# Patient Record
Sex: Female | Born: 1945 | Race: Black or African American | Hispanic: No | Smoking: Never smoker
Health system: Southern US, Community
[De-identification: ages and names within clinical notes are randomized; demographics above are authoritative.]

## PROBLEM LIST (undated history)

## (undated) DIAGNOSIS — E119 Type 2 diabetes mellitus without complications: Secondary | ICD-10-CM

## (undated) DIAGNOSIS — I10 Essential (primary) hypertension: Secondary | ICD-10-CM

## (undated) HISTORY — PX: ABDOMINAL HYSTERECTOMY: SHX81

---

## 2006-08-16 ENCOUNTER — Emergency Department: Payer: Self-pay | Admitting: Emergency Medicine

## 2007-03-23 ENCOUNTER — Ambulatory Visit: Payer: Self-pay | Admitting: Emergency Medicine

## 2007-08-01 ENCOUNTER — Ambulatory Visit: Payer: Self-pay | Admitting: Internal Medicine

## 2007-09-25 ENCOUNTER — Ambulatory Visit: Payer: Self-pay | Admitting: Gastroenterology

## 2008-02-19 ENCOUNTER — Ambulatory Visit: Payer: Self-pay | Admitting: Internal Medicine

## 2008-05-06 ENCOUNTER — Ambulatory Visit: Payer: Self-pay | Admitting: Internal Medicine

## 2008-12-25 ENCOUNTER — Ambulatory Visit: Payer: Self-pay | Admitting: Family Medicine

## 2009-03-18 ENCOUNTER — Emergency Department: Payer: Self-pay | Admitting: Emergency Medicine

## 2009-05-11 ENCOUNTER — Ambulatory Visit: Payer: Self-pay | Admitting: Internal Medicine

## 2009-09-07 ENCOUNTER — Ambulatory Visit: Payer: Self-pay | Admitting: Family Medicine

## 2010-05-12 ENCOUNTER — Ambulatory Visit: Payer: Self-pay | Admitting: Family Medicine

## 2010-07-13 ENCOUNTER — Ambulatory Visit: Payer: Self-pay | Admitting: Family Medicine

## 2011-09-05 ENCOUNTER — Ambulatory Visit: Payer: Self-pay | Admitting: Family Medicine

## 2012-04-30 ENCOUNTER — Emergency Department: Payer: Self-pay | Admitting: Emergency Medicine

## 2012-10-16 ENCOUNTER — Ambulatory Visit: Payer: Self-pay | Admitting: Family Medicine

## 2012-10-28 ENCOUNTER — Ambulatory Visit: Payer: Self-pay

## 2013-10-20 ENCOUNTER — Ambulatory Visit: Payer: Self-pay | Admitting: Family Medicine

## 2014-10-27 ENCOUNTER — Ambulatory Visit: Payer: Self-pay | Admitting: Family Medicine

## 2015-07-09 ENCOUNTER — Ambulatory Visit
Admission: EM | Admit: 2015-07-09 | Discharge: 2015-07-09 | Disposition: A | Discharge status: Home / Self Care | Payer: Medicare Other | Attending: Family Medicine | Admitting: Family Medicine

## 2015-07-09 ENCOUNTER — Encounter: Payer: Self-pay | Admitting: Emergency Medicine

## 2015-07-09 DIAGNOSIS — L309 Dermatitis, unspecified: Secondary | ICD-10-CM | POA: Diagnosis not present

## 2015-07-09 DIAGNOSIS — H6593 Unspecified nonsuppurative otitis media, bilateral: Secondary | ICD-10-CM | POA: Diagnosis not present

## 2015-07-09 DIAGNOSIS — J0100 Acute maxillary sinusitis, unspecified: Secondary | ICD-10-CM | POA: Diagnosis not present

## 2015-07-09 DIAGNOSIS — J302 Other seasonal allergic rhinitis: Secondary | ICD-10-CM | POA: Diagnosis not present

## 2015-07-09 HISTORY — DX: Essential (primary) hypertension: I10

## 2015-07-09 HISTORY — DX: Type 2 diabetes mellitus without complications: E11.9

## 2015-07-09 MED ORDER — DESLORATADINE 5 MG PO TABS: 5.0000 mg | ORAL_TABLET | Freq: Every day | ORAL | Status: DC

## 2015-07-09 MED ORDER — AMOXICILLIN-POT CLAVULANATE 875-125 MG PO TABS: 1.0000 | ORAL_TABLET | Freq: Two times a day (BID) | ORAL | Status: DC

## 2015-07-09 MED ORDER — HYDROCORTISONE 2.5 % EX CREA: TOPICAL_CREAM | Freq: Two times a day (BID) | CUTANEOUS | Status: AC

## 2015-07-09 NOTE — Discharge Instructions (Signed)
Allergies °Allergies may happen from anything your body is sensitive to. This may be food, medicines, pollens, chemicals, and nearly anything around you in everyday life that produces allergens. An allergen is anything that causes an allergy producing substance. Heredity is often a factor in causing these problems. This means you may have some of the same allergies as your parents. °Food allergies happen in all age groups. Food allergies are some of the most severe and life threatening. Some common food allergies are cow's milk, seafood, eggs, nuts, wheat, and soybeans. °SYMPTOMS  °· Swelling around the mouth. °· An itchy red rash or hives. °· Vomiting or diarrhea. °· Difficulty breathing. °SEVERE ALLERGIC REACTIONS ARE LIFE-THREATENING. °This reaction is called anaphylaxis. It can cause the mouth and throat to swell and cause difficulty with breathing and swallowing. In severe reactions only a trace amount of food (for example, peanut oil in a salad) may cause death within seconds. °Seasonal allergies occur in all age groups. These are seasonal because they usually occur during the same season every year. They may be a reaction to molds, grass pollens, or tree pollens. Other causes of problems are house dust mite allergens, pet dander, and mold spores. The symptoms often consist of nasal congestion, a runny itchy nose associated with sneezing, and tearing itchy eyes. There is often an associated itching of the mouth and ears. The problems happen when you come in contact with pollens and other allergens. Allergens are the particles in the air that the body reacts to with an allergic reaction. This causes you to release allergic antibodies. Through a chain of events, these eventually cause you to release histamine into the blood stream. Although it is meant to be protective to the body, it is this release that causes your discomfort. This is why you were given anti-histamines to feel better.  If you are unable to  pinpoint the offending allergen, it may be determined by skin or blood testing. Allergies cannot be cured but can be controlled with medicine. °Hay fever is a collection of all or some of the seasonal allergy problems. It may often be treated with simple over-the-counter medicine such as diphenhydramine. Take medicine as directed. Do not drink alcohol or drive while taking this medicine. Check with your caregiver or package insert for child dosages. °If these medicines are not effective, there are many new medicines your caregiver can prescribe. Stronger medicine such as nasal spray, eye drops, and corticosteroids may be used if the first things you try do not work well. Other treatments such as immunotherapy or desensitizing injections can be used if all else fails. Follow up with your caregiver if problems continue. These seasonal allergies are usually not life threatening. They are generally more of a nuisance that can often be handled using medicine. °HOME CARE INSTRUCTIONS  °· If unsure what causes a reaction, keep a diary of foods eaten and symptoms that follow. Avoid foods that cause reactions. °· If hives or rash are present: °· Take medicine as directed. °· You may use an over-the-counter antihistamine (diphenhydramine) for hives and itching as needed. °· Apply cold compresses (cloths) to the skin or take baths in cool water. Avoid hot baths or showers. Heat will make a rash and itching worse. °· If you are severely allergic: °· Following a treatment for a severe reaction, hospitalization is often required for closer follow-up. °· Wear a medic-alert bracelet or necklace stating the allergy. °· You and your family must learn how to give adrenaline or use   an anaphylaxis kit.  If you have had a severe reaction, always carry your anaphylaxis kit or EpiPen with you. Use this medicine as directed by your caregiver if a severe reaction is occurring. Failure to do so could have a fatal outcome. SEEK MEDICAL  CARE IF:  You suspect a food allergy. Symptoms generally happen within 30 minutes of eating a food.  Your symptoms have not gone away within 2 days or are getting worse.  You develop new symptoms.  You want to retest yourself or your child with a food or drink you think causes an allergic reaction. Never do this if an anaphylactic reaction to that food or drink has happened before. Only do this under the care of a caregiver. SEEK IMMEDIATE MEDICAL CARE IF:   You have difficulty breathing, are wheezing, or have a tight feeling in your chest or throat.  You have a swollen mouth, or you have hives, swelling, or itching all over your body.  You have had a severe reaction that has responded to your anaphylaxis kit or an EpiPen. These reactions may return when the medicine has worn off. These reactions should be considered life threatening. MAKE SURE YOU:   Understand these instructions.  Will watch your condition.  Will get help right away if you are not doing well or get worse. Document Released: 01/09/2003 Document Revised: 02/10/2013 Document Reviewed: 06/15/2008 [facility] Patient Information 2015 [place], [place]. This information is not intended to replace advice given to you by your health care provider. Make sure you discuss any questions you have with your health care provider. Contact Dermatitis Contact dermatitis is a reaction to certain substances that touch the skin. Contact dermatitis can be either irritant contact dermatitis or allergic contact dermatitis. Irritant contact dermatitis does not require previous exposure to the substance for a reaction to occur.Allergic contact dermatitis only occurs if you have been exposed to the substance before. Upon a repeat exposure, your body reacts to the substance.  CAUSES  Many substances can cause contact dermatitis. Irritant dermatitis is most commonly caused by repeated exposure to mildly irritating substances, such  as:  Makeup.  Soaps.  Detergents.  Bleaches.  Acids.  Metal salts, such as nickel. Allergic contact dermatitis is most commonly caused by exposure to:  Poisonous plants.  Chemicals (deodorants, shampoos).  Jewelry.  Latex.  Neomycin in triple antibiotic cream.  Preservatives in products, including clothing. SYMPTOMS  The area of skin that is exposed may develop:  Dryness or flaking.  Redness.  Cracks.  Itching.  Pain or a burning sensation.  Blisters. With allergic contact dermatitis, there may also be swelling in areas such as the eyelids, mouth, or genitals.  DIAGNOSIS  Your caregiver can usually tell what the problem is by doing a physical exam. In cases where the cause is uncertain and an allergic contact dermatitis is suspected, a patch skin test may be performed to help determine the cause of your dermatitis. TREATMENT Treatment includes protecting the skin from further contact with the irritating substance by avoiding that substance if possible. Barrier creams, powders, and gloves may be helpful. Your caregiver may also recommend:  Steroid creams or ointments applied 2 times daily. For best results, soak the rash area in cool water for 20 minutes. Then apply the medicine. Cover the area with a plastic wrap. You can store the steroid cream in the refrigerator for a "chilly" effect on your rash. That may decrease itching. Oral steroid medicines may be needed in more severe cases.  Antibiotics or antibacterial ointments if a skin infection is present.  Antihistamine lotion or an antihistamine taken by mouth to ease itching.  Lubricants to keep moisture in your skin.  Burow's solution to reduce redness and soreness or to dry a weeping rash. Mix one packet or tablet of solution in 2 cups cool water. Dip a clean washcloth in the mixture, wring it out a bit, and put it on the affected area. Leave the cloth in place for 30 minutes. Do this as often as possible  throughout the day.  Taking several cornstarch or baking soda baths daily if the area is too large to cover with a washcloth. Harsh chemicals, such as alkalis or acids, can cause skin damage that is like a burn. You should flush your skin for 15 to 20 minutes with cold water after such an exposure. You should also seek immediate medical care after exposure. Bandages (dressings), antibiotics, and pain medicine may be needed for severely irritated skin.  HOME CARE INSTRUCTIONS  Avoid the substance that caused your reaction.  Keep the area of skin that is affected away from hot water, soap, sunlight, chemicals, acidic substances, or anything else that would irritate your skin.  Do not scratch the rash. Scratching may cause the rash to become infected.  You may take cool baths to help stop the itching.  Only take over-the-counter or prescription medicines as directed by your caregiver.  See your caregiver for follow-up care as directed to make sure your skin is healing properly. SEEK MEDICAL CARE IF:   Your condition is not better after 3 days of treatment.  You seem to be getting worse.  You see signs of infection such as swelling, tenderness, redness, soreness, or warmth in the affected area.  You have any problems related to your medicines. Document Released: 10/13/2000 Document Revised: 01/08/2012 Document Reviewed: 03/21/2011 [facility] Patient Information 2015 [place], [place]. This information is not intended to replace advice given to you by your health care provider. Make sure you discuss any questions you have with your health care provider. Sinusitis Sinusitis is redness, soreness, and inflammation of the paranasal sinuses. Paranasal sinuses are air pockets within the bones of your face (beneath the eyes, the middle of the forehead, or above the eyes). In healthy paranasal sinuses, mucus is able to drain out, and air is able to circulate through them by way of your nose. However,  when your paranasal sinuses are inflamed, mucus and air can become trapped. This can allow bacteria and other germs to grow and cause infection. Sinusitis can develop quickly and last only a short time (acute) or continue over a long period (chronic). Sinusitis that lasts for more than 12 weeks is considered chronic.  CAUSES  Causes of sinusitis include:  Allergies.  Structural abnormalities, such as displacement of the cartilage that separates your nostrils (deviated septum), which can decrease the air flow through your nose and sinuses and affect sinus drainage.  Functional abnormalities, such as when the small hairs (cilia) that line your sinuses and help remove mucus do not work properly or are not present. SIGNS AND SYMPTOMS  Symptoms of acute and chronic sinusitis are the same. The primary symptoms are pain and pressure around the affected sinuses. Other symptoms include:  Upper toothache.  Earache.  Headache.  Bad breath.  Decreased sense of smell and taste.  A cough, which worsens when you are lying flat.  Fatigue.  Fever.  Thick drainage from your nose, which often is green and  may contain pus (purulent).  Swelling and warmth over the affected sinuses. DIAGNOSIS  Your health care provider will perform a physical exam. During the exam, your health care provider may:  Look in your nose for signs of abnormal growths in your nostrils (nasal polyps).  Tap over the affected sinus to check for signs of infection.  View the inside of your sinuses (endoscopy) using an imaging device that has a light attached (endoscope). If your health care provider suspects that you have chronic sinusitis, one or more of the following tests may be recommended:  Allergy tests.  Nasal culture. A sample of mucus is taken from your nose, sent to a lab, and screened for bacteria.  Nasal cytology. A sample of mucus is taken from your nose and examined by your health care provider to determine  if your sinusitis is related to an allergy. TREATMENT  Most cases of acute sinusitis are related to a viral infection and will resolve on their own within 10 days. Sometimes medicines are prescribed to help relieve symptoms (pain medicine, decongestants, nasal steroid sprays, or saline sprays).  However, for sinusitis related to a bacterial infection, your health care provider will prescribe antibiotic medicines. These are medicines that will help kill the bacteria causing the infection.  Rarely, sinusitis is caused by a fungal infection. In theses cases, your health care provider will prescribe antifungal medicine. For some cases of chronic sinusitis, surgery is needed. Generally, these are cases in which sinusitis recurs more than 3 times per year, despite other treatments. HOME CARE INSTRUCTIONS   Drink plenty of water. Water helps thin the mucus so your sinuses can drain more easily.  Use a humidifier.  Inhale steam 3 to 4 times a day (for example, sit in the bathroom with the shower running).  Apply a warm, moist washcloth to your face 3 to 4 times a day, or as directed by your health care provider.  Use saline nasal sprays to help moisten and clean your sinuses.  Take medicines only as directed by your health care provider.  If you were prescribed either an antibiotic or antifungal medicine, finish it all even if you start to feel better. SEEK IMMEDIATE MEDICAL CARE IF:  You have increasing pain or severe headaches.  You have nausea, vomiting, or drowsiness.  You have swelling around your face.  You have vision problems.  You have a stiff neck.  You have difficulty breathing. MAKE SURE YOU:   Understand these instructions.  Will watch your condition.  Will get help right away if you are not doing well or get worse. Document Released: 10/16/2005 Document Revised: 03/02/2014 Document Reviewed: 10/31/2011 [facility] Patient Information 2015 [place], [place]. This information  is not intended to replace advice given to you by your health care provider. Make sure you discuss any questions you have with your health care provider.

## 2015-07-09 NOTE — ED Notes (Addendum)
Patient c/o sinus pressure and left ear pain since yesterday.

## 2015-07-11 NOTE — ED Provider Notes (Signed)
CSN: [id number]     Arrival date & time 07/09/15  1436 History   First MD Initiated Contact with Patient 07/09/15 1520     Chief Complaint  Patient presents with  . Facial Pain  . Otalgia   (Consider location/radiation/quality/duration/timing/severity/associated sxs/prior Treatment) HPI Comments: Single caucasian female here for evaluation of rash near her bra strap on torso.  Had similar a couple years ago was given cream by provider and it cleared up.  Patient didn't have any more burn cream at home to apply like last time.  Red, tender without discharge or itchiness.  Patient also complained of cheek pressure has not had a sinus infection this year cannot remember what antibiotics she used in the past.  Using flonase daily and it is not helping right now.  Fingerstick 130 yesterday at home has not yet checked today.  The history is provided by the patient.    Past Medical History  Diagnosis Date  . Hypertension   . Diabetes mellitus without complication    Past Surgical History  Procedure Laterality Date  . Abdominal hysterectomy     History reviewed. No pertinent family history. Social History  Substance Use Topics  . Smoking status: Never Smoker   . Smokeless tobacco: None  . Alcohol Use: No   OB History    No data available     Review of Systems  Constitutional: Negative for fever, chills, diaphoresis, activity change, appetite change and fatigue.  HENT: Positive for congestion, postnasal drip, rhinorrhea and sinus pressure. Negative for dental problem, drooling, ear discharge, ear pain, facial swelling, hearing loss, mouth sores, nosebleeds, sneezing, sore throat, tinnitus, trouble swallowing and voice change.   Eyes: Negative for photophobia, pain, discharge, redness, itching and visual disturbance.  Respiratory: Negative for cough, shortness of breath, wheezing and stridor.   Cardiovascular: Negative for chest pain, palpitations and leg swelling.  Gastrointestinal:  Negative for nausea, vomiting, abdominal pain, diarrhea, constipation, blood in stool and abdominal distention.  Endocrine: Negative for cold intolerance and heat intolerance.  Genitourinary: Negative for dysuria and hematuria.  Musculoskeletal: Negative for myalgias, back pain, joint swelling, arthralgias, gait problem, neck pain and neck stiffness.  Skin: Positive for color change and rash. Negative for pallor and wound.  Allergic/Immunologic: Positive for environmental allergies. Negative for food allergies.  Neurological: Negative for dizziness, tremors, seizures, syncope, facial asymmetry, speech difficulty, weakness, light-headedness, numbness and headaches.  Hematological: Negative for adenopathy. Does not bruise/bleed easily.  Psychiatric/Behavioral: Negative for behavioral problems, confusion, sleep disturbance and agitation.    Allergies  Review of patient's allergies indicates no known allergies.  Home Medications   Prior to Admission medications   Medication Sig Start Date End Date Taking? Authorizing Provider  aspirin 325 MG tablet Take 325 mg by mouth daily.   Yes Historical Provider, MD  fluticasone (FLONASE) 50 MCG/ACT nasal spray Place 1 spray into both nostrils daily.   Yes Historical Provider, MD  losartan (COZAAR) 50 MG tablet Take 100 mg by mouth daily.    Yes Historical Provider, MD  metFORMIN (GLUCOPHAGE) 1000 MG tablet Take 1,000 mg by mouth 2 (two) times daily with a meal.   Yes Historical Provider, MD  ranitidine (ZANTAC) 300 MG capsule Take 300 mg by mouth every evening.   Yes Historical Provider, MD  amoxicillin-clavulanate (AUGMENTIN) 875-125 MG per tablet Take 1 tablet by mouth every 12 (twelve) hours. 07/09/15   Barbaraann Barthel, NP  desloratadine (CLARINEX) 5 MG tablet Take 1 tablet (5 mg total) by  mouth daily. 07/09/15   Barbaraann Barthel, NP  hydrocortisone 2.5 % cream Apply topically 2 (two) times daily. Affected area for 7 to 10 days wash hands after applying  07/09/15   Barbaraann Barthel, NP   Meds Ordered and Administered this Visit  Medications - No data to display  BP 158/86 mmHg  Pulse 60  Temp(Src) 96.9 F (36.1 C) (Tympanic)  Resp 16  Ht 5\' 3"  (1.6 m)  Wt 175 lb (79.379 kg)  BMI 31.01 kg/m2  SpO2 96% No data found.   Physical Exam  Constitutional: She is oriented to person, place, and time. Vital signs are normal. She appears well-developed and well-nourished. No distress.  HENT:  Head: Normocephalic and atraumatic.  Right Ear: Hearing, external ear and ear canal normal. A middle ear effusion is present.  Left Ear: Hearing, external ear and ear canal normal. A middle ear effusion is present.  Nose: Mucosal edema and rhinorrhea present. No nose lacerations, sinus tenderness, nasal deformity, septal deviation or nasal septal hematoma. No epistaxis.  No foreign bodies. Right sinus exhibits maxillary sinus tenderness. Right sinus exhibits no frontal sinus tenderness. Left sinus exhibits maxillary sinus tenderness. Left sinus exhibits no frontal sinus tenderness.  Mouth/Throat: Uvula is midline and mucous membranes are normal. Mucous membranes are not pale, not dry and not cyanotic. She does not have dentures. No oral lesions. No trismus in the jaw. Normal dentition. No dental abscesses, uvula swelling, lacerations or dental caries. Posterior oropharyngeal edema and posterior oropharyngeal erythema present. No oropharyngeal exudate or tonsillar abscesses.  Cobblestoning posterior pharynx; bilateral TMs with air fluid level; maxillary sinsuses TTP bilaterally; nasal turbinates with edema/erythema clear/yellow discharge bilaterally  Eyes: Conjunctivae, EOM and lids are normal. Pupils are equal, round, and reactive to light. Right eye exhibits no discharge. Left eye exhibits no discharge. No scleral icterus.  Neck: Trachea normal and normal range of motion. Neck supple. No tracheal deviation present. No thyromegaly present.  Cardiovascular: Normal  rate, regular rhythm, S1 normal, S2 normal, normal heart sounds and intact distal pulses.  PMI is not displaced.  Exam reveals no gallop and no friction rub.   No murmur heard. Pulmonary/Chest: Effort normal. No accessory muscle usage or stridor. No respiratory distress. She has no decreased breath sounds. She has no wheezes. She has no rhonchi. She has no rales.  Abdominal: Soft. She exhibits no distension.  Musculoskeletal: Normal range of motion. She exhibits no edema or tenderness.  Lymphadenopathy:    She has no cervical adenopathy.  Neurological: She is alert and oriented to person, place, and time. She exhibits normal muscle tone. Coordination normal.  Skin: Skin is warm, dry and intact. Rash noted. No abrasion, no bruising, no burn, no ecchymosis, no laceration, no lesion, no petechiae and no purpura noted. Rash is maculopapular. Rash is not macular, not papular, not nodular, not pustular, not vesicular and not urticarial. She is not diaphoretic. There is erythema. No cyanosis. No pallor. Nails show no clubbing.     2-macular papular lesions dry erythematous slightly ttp lateral thorax superior to chest band of bra  Psychiatric: She has a normal mood and affect. Her speech is normal and behavior is normal. Judgment and thought content normal. Cognition and memory are normal.  Nursing note and vitals reviewed.   ED Course  Procedures (including critical care time)  Labs Review Labs Reviewed - No data to display  Imaging Review No results found.    MDM   1. Acute maxillary sinusitis, recurrence  not specified   2. Otitis media with effusion, bilateral   3. Dermatitis   4. Other seasonal allergic rhinitis   Patient already on flonase without relief of symptoms.  Start augmentin 875mg  po BID x 10 days.  Nasal saline 2 sprays each nostril q2h while awake.  Start clarinex 5mg  po daily as history allergic rhinitis not controlled with flonase.  No evidence of systemic bacterial  infection, non toxic and well hydrated.  I do not see where any further testing or imaging is necessary at this time.   I will suggest supportive care, rest, good hygiene and encourage the patient to take adequate fluids.  Avoid triggers if possible.  Shower prior to bedtime if exposed to triggers.  If allergic dust/dust mites recommend mattress/pillow covers/encasements; washing linens, vacuuming, sweeping, dusting weekly.  The patient is to return to clinic or EMERGENCY ROOM if symptoms worsen or change significantly.  Exitcare handout on sinusitis given to patient.  Patient verbalized agreement and understanding of treatment plan and had no further questions at this time.   P2:  Avoidance, Hand washing and cover cough  Supportive treatment.   No evidence of invasive bacterial infection, non toxic and well hydrated.  This is most likely self limiting viral infection.  I do not see where any further testing or imaging is necessary at this time.   I will suggest supportive care, rest, good hygiene and encourage the patient to take adequate fluids.  The patient is to return to clinic or EMERGENCY ROOM if symptoms worsen or change significantly e.g. ear pain, fever, purulent discharge from ears or bleeding.  Exitcare handout on otitis media with effusion given to patient.  Patient verbalized agreement and understanding of treatment plan.    Medication as directed. Hydrocortisone 2.5% cream BID topical to affected area.  Wash hands after application.  Avoid scratching.  Follow up if worsening erythema, rash spreading, purulent discharge, pain, fever.   Symptomatic therapy suggested.  Warm to cool water soaks and/or oatmeal baths.  Call or return to clinic as needed if these symptoms worsen or fail to improve as anticipated.  Exitcare handout on dermatitis given to patient.  Patient verbalized agreement and understanding of treatment plan and had no further questions at this time.   P2:  Avoidance and hand  washing.    Barbaraann Barthel, NP 07/11/15 [phone]

## 2016-04-07 ENCOUNTER — Ambulatory Visit
Admission: EM | Admit: 2016-04-07 | Discharge: 2016-04-07 | Disposition: A | Discharge status: Home / Self Care | Payer: BC Managed Care – PPO | Attending: Family Medicine | Admitting: Family Medicine

## 2016-04-07 DIAGNOSIS — M5432 Sciatica, left side: Secondary | ICD-10-CM | POA: Diagnosis not present

## 2016-04-07 DIAGNOSIS — M5442 Lumbago with sciatica, left side: Secondary | ICD-10-CM | POA: Diagnosis not present

## 2016-04-07 MED ORDER — PREDNISONE 20 MG PO TABS: ORAL_TABLET | ORAL | Status: DC

## 2016-04-07 MED ORDER — HYDROCODONE-ACETAMINOPHEN 5-325 MG PO TABS: ORAL_TABLET | ORAL | Status: DC

## 2016-04-07 MED ORDER — CYCLOBENZAPRINE HCL 10 MG PO TABS: 10.0000 mg | ORAL_TABLET | Freq: Every day | ORAL | Status: DC

## 2016-04-07 NOTE — ED Provider Notes (Signed)
CSN: [id number]     Arrival date & time 04/07/16  1538 History   First MD Initiated Contact with Patient 04/07/16 1626     Chief Complaint  Patient presents with  . Back Pain   (Consider location/radiation/quality/duration/timing/severity/associated sxs/prior Treatment) HPI Comments: 70 yo female with a 4 days h/o left lower back and left buttock pain radiating down the left leg. Symptoms started after she had been sweeping the porch. Denies any fall, injury, trauma. Denies any fevers, chills, numbness/tingling, weakness, saddle anesthesia, bowel or bladder problems.   The history is provided by the patient.    Past Medical History  Diagnosis Date  . Hypertension   . Diabetes mellitus without complication [facility])    Past Surgical History  Procedure Laterality Date  . Abdominal hysterectomy     No family history on file. Social History  Substance Use Topics  . Smoking status: Never Smoker   . Smokeless tobacco: None  . Alcohol Use: No   OB History    No data available     Review of Systems  Allergies  Review of patient's allergies indicates no known allergies.  Home Medications   Prior to Admission medications   Medication Sig Start Date End Date Taking? Authorizing Provider  aspirin 325 MG tablet Take 325 mg by mouth daily.   Yes Historical Provider, MD  fluticasone (FLONASE) 50 MCG/ACT nasal spray Place 1 spray into both nostrils daily.   Yes Historical Provider, MD  losartan (COZAAR) 50 MG tablet Take 100 mg by mouth daily.    Yes Historical Provider, MD  metFORMIN (GLUCOPHAGE) 1000 MG tablet Take 1,000 mg by mouth 2 (two) times daily with a meal.   Yes Historical Provider, MD  ranitidine (ZANTAC) 300 MG capsule Take 300 mg by mouth every evening.   Yes Historical Provider, MD  amoxicillin-clavulanate (AUGMENTIN) 875-125 MG per tablet Take 1 tablet by mouth every 12 (twelve) hours. 07/09/15   Barbaraann Barthel, NP  cyclobenzaprine (FLEXERIL) 10 MG tablet Take 1 tablet (10  mg total) by mouth at bedtime. 04/07/16   Payton Mccallum, MD  desloratadine (CLARINEX) 5 MG tablet Take 1 tablet (5 mg total) by mouth daily. 07/09/15   Barbaraann Barthel, NP  HYDROcodone-acetaminophen (NORCO/VICODIN) 5-325 MG tablet Half to one tab po qhs prn pain 04/07/16   Payton Mccallum, MD  hydrocortisone 2.5 % cream Apply topically 2 (two) times daily. Affected area for 7 to 10 days wash hands after applying 07/09/15   Barbaraann Barthel, NP  predniSONE (DELTASONE) 20 MG tablet 2 tabs po qd for 5 days 04/07/16   Payton Mccallum, MD   Meds Ordered and Administered this Visit  Medications - No data to display  BP 142/72 mmHg  Pulse 78  Temp(Src) 98.2 F (36.8 C) (Oral)  Resp 18  Ht 5\' 2"  (1.575 m)  Wt 172 lb (78.019 kg)  BMI 31.45 kg/m2  SpO2 98% No data found.   Physical Exam  Constitutional: She appears well-developed and well-nourished. No distress.  Musculoskeletal: She exhibits tenderness. She exhibits no edema.       Lumbar back: She exhibits tenderness (over the left lumbar sacral paraspinous muscles and left buttock) and spasm. She exhibits normal range of motion, no bony tenderness, no swelling, no edema, no deformity, no laceration, no pain and normal pulse.       Back:  Neurological: She is alert. She has normal reflexes. She exhibits normal muscle tone.  Skin: Skin is warm and dry. No  rash noted. She is not diaphoretic. No erythema.  Nursing note and vitals reviewed.   ED Course  Procedures (including critical care time)  Labs Review Labs Reviewed - No data to display  Imaging Review No results found.   Visual Acuity Review  Right Eye Distance:   Left Eye Distance:   Bilateral Distance:    Right Eye Near:   Left Eye Near:    Bilateral Near:         MDM   1. Sciatica of left side   2. Left-sided low back pain with left-sided sciatica    Discharge Medication List as of 04/07/2016  4:52 PM    START taking these medications   Details  cyclobenzaprine  (FLEXERIL) 10 MG tablet Take 1 tablet (10 mg total) by mouth at bedtime., Starting 04/07/2016, Until Discontinued, Normal    HYDROcodone-acetaminophen (NORCO/VICODIN) 5-325 MG tablet Half to one tab po qhs prn pain, Print    predniSONE (DELTASONE) 20 MG tablet 2 tabs po qd for 5 days, Normal        1. diagnosis reviewed with patient 2. rx as per orders above; reviewed possible side effects, interactions, risks and benefits  3. Recommend supportive treatment with rest, heat, gentle stretching 4. Follow-up prn if symptoms worsen or don't  improve  Payton Mccallumrlando Jep Dyas, MD 04/07/16 1950

## 2016-04-07 NOTE — Discharge Instructions (Signed)

## 2016-04-07 NOTE — ED Notes (Signed)
Pt c/o left lower back pain that radiates into the left leg since Tuesday, states it started while she was sweeping the porch.

## 2016-09-06 ENCOUNTER — Ambulatory Visit (INDEPENDENT_AMBULATORY_CARE_PROVIDER_SITE_OTHER): Payer: BC Managed Care – PPO

## 2016-09-06 ENCOUNTER — Encounter: Payer: Self-pay | Admitting: Emergency Medicine

## 2016-09-06 ENCOUNTER — Ambulatory Visit
Admission: EM | Admit: 2016-09-06 | Discharge: 2016-09-06 | Disposition: A | Discharge status: Home / Self Care | Payer: BC Managed Care – PPO | Attending: Family Medicine | Admitting: Family Medicine

## 2016-09-06 DIAGNOSIS — M25512 Pain in left shoulder: Secondary | ICD-10-CM | POA: Diagnosis not present

## 2016-09-06 DIAGNOSIS — M7522 Bicipital tendinitis, left shoulder: Secondary | ICD-10-CM | POA: Diagnosis not present

## 2016-09-06 DIAGNOSIS — I10 Essential (primary) hypertension: Secondary | ICD-10-CM | POA: Diagnosis not present

## 2016-09-06 MED ORDER — MELOXICAM 7.5 MG PO TABS: 7.5000 mg | ORAL_TABLET | Freq: Two times a day (BID) | ORAL | 0 refills | Status: AC | PRN

## 2016-09-06 NOTE — ED Triage Notes (Signed)
Patient c/o pain in her left shoulder that radiates down her left arm since October.  Patient denies chest pain or SOB.

## 2016-09-06 NOTE — ED Provider Notes (Signed)
[facility]    CSN: [id number] Arrival date & time: 09/06/16  1324     History   Chief Complaint Chief Complaint  Patient presents with  . Shoulder Pain  . Arm Pain    HPI Jade Blevins is a 70 y.o. female.   Patient is a 70 year old black female with left shoulder pain. She's had shoulder pain now for several weeks he states that the pain started after she got her flu shot in mid to early October. She didn't dealing with the left shoulder pain until today when the pain became excruciating. She reports trying to lift a heavy iron skillet and had sharp pain going in her left shoulder. She states that she was unable to function at work so she came here. Past medical history no history of hypertension or diabetes that she's being treated for but she states she's borderline in both those conditions. She's had abdominal hysterectomy area no known drug allergies. No pertinent family history relevant to today's visit.   The history is provided by the patient.  Shoulder Pain  Location:  Shoulder Shoulder location:  L shoulder Pain details:    Quality:  Aching and sharp   Radiates to:  Does not radiate   Severity:  Severe   Onset quality:  Unable to specify   Timing:  Constant   Progression:  Worsening Handedness:  Right-handed Dislocation: no   Foreign body present:  No foreign bodies Relieved by:  Nothing Worsened by:  Nothing Ineffective treatments:  None tried Associated symptoms: muscle weakness   Associated symptoms: no back pain, no fatigue, no fever, no neck pain and no stiffness   Risk factors: no concern for non-accidental trauma and no frequent fractures   Arm Pain     Past Medical History:  Diagnosis Date  . Diabetes mellitus without complication (HCC)   . Hypertension     There are no active problems to display for this patient.   Past Surgical History:  Procedure Laterality Date  . ABDOMINAL HYSTERECTOMY      OB History    No data  available       Home Medications    Prior to Admission medications   Medication Sig Start Date End Date Taking? Authorizing Provider  aspirin 325 MG tablet Take 325 mg by mouth daily.    Historical Provider, MD  fluticasone (FLONASE) 50 MCG/ACT nasal spray Place 1 spray into both nostrils daily.    Historical Provider, MD  hydrocortisone 2.5 % cream Apply topically 2 (two) times daily. Affected area for 7 to 10 days wash hands after applying 07/09/15   Barbaraann Barthelina A Betancourt, NP  losartan (COZAAR) 50 MG tablet Take 100 mg by mouth daily.     Historical Provider, MD  meloxicam (MOBIC) 7.5 MG tablet Take 1 tablet (7.5 mg total) by mouth 2 (two) times daily as needed for pain. 09/06/16   Hassan RowanEugene Kenyanna Grzesiak, MD  metFORMIN (GLUCOPHAGE) 1000 MG tablet Take 1,000 mg by mouth 2 (two) times daily with a meal.    Historical Provider, MD  ranitidine (ZANTAC) 300 MG capsule Take 300 mg by mouth every evening.    Historical Provider, MD    Family History History reviewed. No pertinent family history.  Social History Social History  Substance Use Topics  . Smoking status: Never Smoker  . Smokeless tobacco: Never Used  . Alcohol use No     Allergies   Patient has no known allergies.   Review of Systems Review  of Systems  Constitutional: Negative for fatigue and fever.  Musculoskeletal: Positive for arthralgias and myalgias. Negative for back pain, neck pain and stiffness.     Physical Exam Triage Vital Signs ED Triage Vitals  Enc Vitals Group     BP 09/06/16 1336 139/64     Pulse Rate 09/06/16 1336 76     Resp 09/06/16 1336 16     Temp 09/06/16 1336 97.3 F (36.3 C)     Temp Source 09/06/16 1336 Tympanic     SpO2 09/06/16 1336 98 %     Weight 09/06/16 1335 172 lb (78 kg)     Height 09/06/16 1335 5\' 2"  (1.575 m)     Head Circumference --      Peak Flow --      Pain Score 09/06/16 1337 9     Pain Loc --      Pain Edu? --      Excl. in GC? --    No data found.   Updated Vital Signs BP  139/64 (BP Location: Right Arm)   Pulse 76   Temp 97.3 F (36.3 C) (Tympanic)   Resp 16   Ht 5\' 2"  (1.575 m)   Wt 172 lb (78 kg)   SpO2 98%   BMI 31.46 kg/m   Visual Acuity Right Eye Distance:   Left Eye Distance:   Bilateral Distance:    Right Eye Near:   Left Eye Near:    Bilateral Near:     Physical Exam  Constitutional: She is oriented to person, place, and time. She appears well-developed.  HENT:  Head: Normocephalic and atraumatic.  Right Ear: External ear normal.  Eyes: Conjunctivae are normal. Pupils are equal, round, and reactive to light.  Neck: Normal range of motion. Neck supple.  Cardiovascular: Normal rate, regular rhythm and normal heart sounds.   Pulmonary/Chest: Effort normal and breath sounds normal.  Musculoskeletal: She exhibits tenderness.       Left shoulder: She exhibits decreased range of motion, tenderness, bony tenderness and pain.       Arms: Patient is able to resist pressure on the "empty beer can maneuver, and with effort she is able to raise left arm above her head which indicates no stiff to rotator cuff injury but she has pinpoint tenderness over the left biceps tendon consistent with biceps tendinitis  Neurological: She is alert and oriented to person, place, and time. No cranial nerve deficit.  Skin: Skin is warm.  Psychiatric: She has a normal mood and affect.  Vitals reviewed.    UC Treatments / Results  Labs (all labs ordered are listed, but only abnormal results are displayed) Labs Reviewed - No data to display  EKG  EKG Interpretation None     ED ECG REPORT I, Esme Durkin H, the attending physician, personally viewed and interpreted this ECG.   Date: 09/06/2016  EKG Time: 13:45:40  Rate: 74  Rhythm: normal EKG, normal sinus rhythm, there are no previous tracings available for comparison  Axis: 33  Intervals:none  ST&T Change: none  Radiology No results found.  Procedures Procedures (including critical care  time)  Medications Ordered in UC Medications - No data to display   Initial Impression / Assessment and Plan / UC Course  I have reviewed the triage vital signs and the nursing notes.  Pertinent labs & imaging results that were available during my care of the patient were reviewed by me and considered in my medical decision making (see chart  for details).  Clinical Course    Patient will have x-ray of her left shoulder but informed her that she probably wouldn't need to have a injection in the bicipital tendon area by an orthopedic or by her PCP. But in the meantime we will try her on some Mobic 7.5 one tablet once twice a day Cipro helps. Will give a work note for today and tomorrow.   Final Clinical Impressions(s) / UC Diagnoses   Final diagnoses:  Acute pain of left shoulder  Biceps tendonitis on left    New Prescriptions New Prescriptions   MELOXICAM (MOBIC) 7.5 MG TABLET    Take 1 tablet (7.5 mg total) by mouth 2 (two) times daily as needed for pain.     Hassan Rowan, MD 09/06/16 1500

## 2021-07-12 ENCOUNTER — Encounter: Payer: Self-pay | Admitting: Emergency Medicine

## 2021-07-12 ENCOUNTER — Ambulatory Visit
Admission: EM | Admit: 2021-07-12 | Discharge: 2021-07-12 | Disposition: A | Discharge status: Home / Self Care | Payer: BC Managed Care – PPO

## 2021-07-12 ENCOUNTER — Ambulatory Visit (INDEPENDENT_AMBULATORY_CARE_PROVIDER_SITE_OTHER): Payer: BC Managed Care – PPO

## 2021-07-12 ENCOUNTER — Other Ambulatory Visit: Payer: Self-pay

## 2021-07-12 DIAGNOSIS — M25531 Pain in right wrist: Secondary | ICD-10-CM

## 2021-07-12 DIAGNOSIS — M25431 Effusion, right wrist: Secondary | ICD-10-CM | POA: Diagnosis not present

## 2021-07-12 DIAGNOSIS — S62001A Unspecified fracture of navicular [scaphoid] bone of right wrist, initial encounter for closed fracture: Secondary | ICD-10-CM | POA: Diagnosis not present

## 2021-07-12 MED ORDER — TRAMADOL HCL 50 MG PO TABS: 50.0000 mg | ORAL_TABLET | Freq: Three times a day (TID) | ORAL | 0 refills | Status: AC | PRN

## 2021-07-12 NOTE — Discharge Instructions (Addendum)
HAND FRACTURE: You have a small displaced fracture in your hand.  This is something that will require you to follow-up with orthopedics in the next couple of days.  You have been placed in a splint that you cannot take off and given a sling.  Wear the sling as needed for comfort.  Keep the hand/wrist elevated to help with swelling.  I have sent in tramadol for pain.  This may make you little drowsy so take it at home and do not try to drive while taking.  Do not use affected extremity until cleared by Ortho.  You have a condition requiring you to follow up with Orthopedics so please call one of the following office for appointment:   [facility] [address] [place], [place] [address] Phone: [phone]  [facility] [address], [place], [place] [address] Phone: [phone]

## 2021-07-12 NOTE — ED Triage Notes (Signed)
Pt c/o right wrist pain. Started about 3 weeks. She was seen at [facility] and given prednisone. She finished the prednisone and does not feel any better. She states she did not get an xray. She states the pain is keeping her up at night. She has swelling in the area also. She is wearing a wrist brace and states it helps.

## 2021-07-12 NOTE — ED Provider Notes (Signed)
[facility]    CSN: [id number] Arrival date & time: 07/12/21  1552      History   Chief Complaint Chief Complaint  Patient presents with   Wrist Pain    right    HPI Jade Blevins is a 75 y.o. female presenting for 3-week history of pain and swelling of the right hand and wrist.  Patient denies any known injury.  She says she fell 4 months ago, but no other injuries more recent. She did go to [facility] urgent care at the onset of her pain and was unable to have an x-ray due to machines being down.  Patient was given 5-day course of prednisone but says it did not help.  She has been wearing a wrist brace and taking Tylenol which seemed to help a little but admits to continued pain and concerns regarding this.  No known history of arthritis of her hand or wrist.  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  No recent worsening of symptoms.  No numbness, tingling or weakness.  No other concerns.  HPI  Past Medical History:  Diagnosis Date   Diabetes mellitus without complication (HCC)    Hypertension     There are no problems to display for this patient.   Past Surgical History:  Procedure Laterality Date   ABDOMINAL HYSTERECTOMY      OB History   No obstetric history on file.      Home Medications    Prior to Admission medications   Medication Sig Start Date End Date Taking? Authorizing Provider  aspirin 81 MG EC tablet Take 1 tablet by mouth daily. 05/02/20  Yes [provider]  RYBELSUS 3 MG TABS Take 1 tablet by mouth every morning. 07/11/21  Yes [provider]  traMADol (ULTRAM) 50 MG tablet Take 1 tablet (50 mg total) by mouth every 8 (eight) hours as needed for up to 5 days. 07/12/21 07/17/21 Yes Eusebio Friendly B, PA-C  amLODipine (NORVASC) 10 MG tablet Take 10 mg by mouth daily. 02/15/21   [provider]  aspirin 325 MG tablet Take 325 mg by mouth daily.    [provider]  fluticasone (FLONASE) 50 MCG/ACT nasal spray Place 1  spray into both nostrils daily.    [provider]  hydrocortisone 2.5 % cream Apply topically 2 (two) times daily. Affected area for 7 to 10 days wash hands after applying 07/09/15   Betancourt, Jarold Song, NP  LEVEMIR FLEXTOUCH 100 UNIT/ML FlexTouch Pen Inject into the skin. 06/17/21   [provider]  losartan (COZAAR) 50 MG tablet Take 100 mg by mouth daily.     [provider]  meloxicam (MOBIC) 7.5 MG tablet Take 1 tablet (7.5 mg total) by mouth 2 (two) times daily as needed for pain. 09/06/16   Hassan Rowan, MD  metFORMIN (GLUCOPHAGE) 1000 MG tablet Take 1,000 mg by mouth 2 (two) times daily with a meal.    [provider]  ranitidine (ZANTAC) 300 MG capsule Take 300 mg by mouth every evening.    [provider]    Family History No family history on file.  Social History Social History   Tobacco Use   Smoking status: Never   Smokeless tobacco: Never  Vaping Use   Vaping Use: Never used  Substance Use Topics   Alcohol use: No   Drug use: Not Currently     Allergies   Patient has no known allergies.   Review of Systems  Review of Systems  Musculoskeletal:  Positive for arthralgias and joint swelling.  Skin:  Negative for color change and rash.  Neurological:  Negative for weakness and numbness.    Physical Exam Triage Vital Signs ED Triage Vitals  Enc Vitals Group     BP 07/12/21 1610 (!) 175/81     Pulse Rate 07/12/21 1610 (!) 58     Resp 07/12/21 1610 18     Temp 07/12/21 1610 98.8 F (37.1 C)     Temp Source 07/12/21 1610 Oral     SpO2 07/12/21 1610 95 %     Weight 07/12/21 1604 171 lb 15.3 oz (78 kg)     Height 07/12/21 1604 5\' 2"  (1.575 m)     Head Circumference --      Peak Flow --      Pain Score 07/12/21 1604 7     Pain Loc --      Pain Edu? --      Excl. in GC? --    No data found.  Updated Vital Signs BP (!) 175/81 (BP Location: Left Arm)   Pulse (!) 58   Temp 98.8 F (37.1 C) (Oral)   Resp 18   Ht 5'  2" (1.575 m)   Wt 171 lb 15.3 oz (78 kg)   SpO2 95%   BMI 31.45 kg/m      Physical Exam Vitals and nursing note reviewed.  Constitutional:      General: She is not in acute distress.    Appearance: Normal appearance. She is not ill-appearing or toxic-appearing.  HENT:     Head: Normocephalic and atraumatic.  Eyes:     General: No scleral icterus.       Right eye: No discharge.        Left eye: No discharge.     Conjunctiva/sclera: Conjunctivae normal.  Cardiovascular:     Rate and Rhythm: Regular rhythm. Bradycardia present.     Pulses: Normal pulses.  Pulmonary:     Effort: Pulmonary effort is normal. No respiratory distress.  Musculoskeletal:     Cervical back: Neck supple.     Comments: RIGHT HAND/ WRIST: Mild/moderate swelling distal radial wrist and base of thumb. Swelling also noted over second and third MCP joints. Decreased ROM of hand and wrist.  TTP over scaphoid, distal radius and second and third MCP joints.  2+ radial pulses.  Skin:    General: Skin is dry.  Neurological:     General: No focal deficit present.     Mental Status: She is alert. Mental status is at baseline.     Motor: No weakness.     Coordination: Coordination normal.     Gait: Gait normal.  Psychiatric:        Mood and Affect: Mood normal.        Behavior: Behavior normal.        Thought Content: Thought content normal.     UC Treatments / Results  Labs (all labs ordered are listed, but only abnormal results are displayed) Labs Reviewed - No data to display  EKG   Radiology DG Wrist Complete Right  Result Date: 07/12/2021 CLINICAL DATA:  Right wrist swelling and pain for 3 weeks, no known injury, initial encounter EXAM: RIGHT WRIST - COMPLETE 3+ VIEW COMPARISON:  None. FINDINGS: Mild soft tissue swelling is noted. There is a minimally displaced fracture through the midshaft of the scaphoid bone identified. No other bony abnormality is seen. IMPRESSION: Minimally displaced midshaft  scaphoid fracture. These results will be called to the ordering clinician or representative by the Radiologist Assistant, and communication documented in the PACS or [employer]. Electronically Signed   By: Alcide Clever M.D.   On: 07/12/2021 16:29    Procedures Procedures (including critical care time)  Medications Ordered in UC Medications - No data to display  Initial Impression / Assessment and Plan / UC Course  I have reviewed the triage vital signs and the nursing notes.  Pertinent labs & imaging results that were available during my care of the patient were reviewed by me and considered in my medical decision making (see chart for details).  75 year old female presenting for apparent atraumatic right hand and wrist pain for the past 3 weeks.  Exam does reveal swelling over the second and third MCP joints and distal radial wrist as well as base of thumb.  Tenderness palpation of the scaphoid, distal radius and second and third MCP joints.  Decreased range of motion, good pulses.  X-ray ordered and independently reviewed by me.   X-ray today shows minimally displaced midshaft scaphoid fracture.  Reviewed results with patient.  She cannot recall any recent injuries.  Patient placed in thumb spica splint and sling.  Advised that she needs to follow-up with orthopedics since this can be a complicated injury since it is involving the scaphoid bone.  May require surgery. Patient advised to follow-up with [facility] or [facility] in the next couple of days.  I did send a short supply of Ultram for pain relief.  Reviewed supportive care guidelines.  Patient plans to follow-up with [facility] in the next 1 to 2 days.  Final Clinical Impressions(s) / UC Diagnoses   Final diagnoses:  Closed displaced fracture of scaphoid of right wrist, unspecified portion of scaphoid, initial encounter  Right wrist pain  Wrist swelling, right     Discharge Instructions      HAND FRACTURE:  You have a small displaced fracture in your hand.  This is something that will require you to follow-up with orthopedics in the next couple of days.  You have been placed in a splint that you cannot take off and given a sling.  Wear the sling as needed for comfort.  Keep the hand/wrist elevated to help with swelling.  I have sent in tramadol for pain.  This may make you little drowsy so take it at home and do not try to drive while taking.  Do not use affected extremity until cleared by Ortho.  You have a condition requiring you to follow up with Orthopedics so please call one of the following office for appointment:   [facility] [address] [place], [place] [address] Phone: [phone]  [facility] [address] [place], [place] [address] Phone: [phone]      ED Prescriptions     Medication Sig Dispense Auth. Provider   traMADol (ULTRAM) 50 MG tablet Take 1 tablet (50 mg total) by mouth every 8 (eight) hours as needed for up to 5 days. 15 tablet Shirlee Latch, PA-C      I have reviewed the PDMP during this encounter.   Shirlee Latch, PA-C 07/12/21 1702

## 2023-04-14 IMAGING — CR DG WRIST COMPLETE 3+V*R*
4 series · 4 of 4 positions shown · non-contrast
Comparison: None.

CLINICAL DATA: Right wrist swelling and pain for 3 weeks, no known
injury, initial encounter

EXAM:
RIGHT WRIST - COMPLETE 3+ VIEW

[wrist pa]
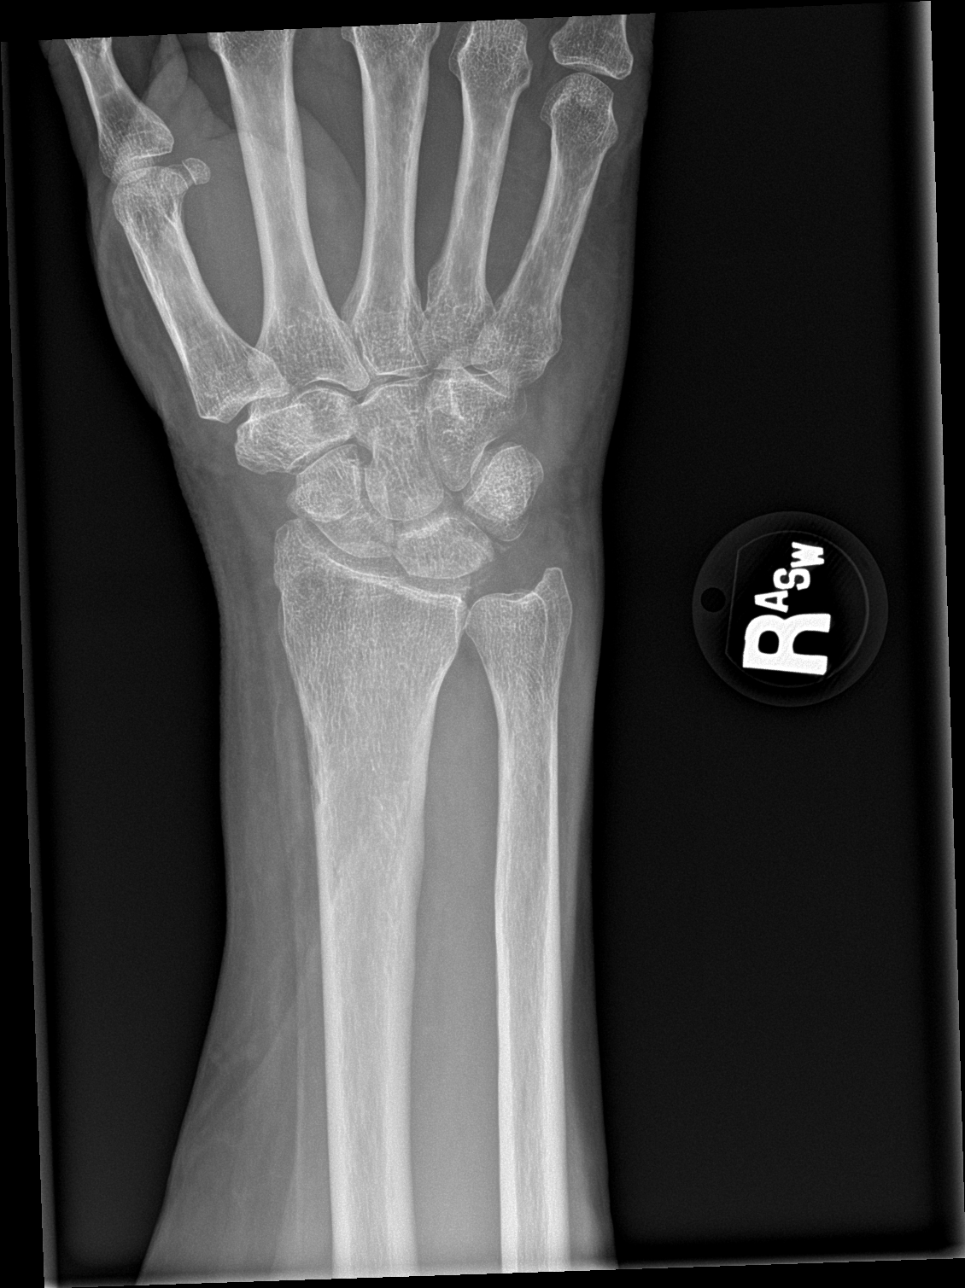

[wrist obl]
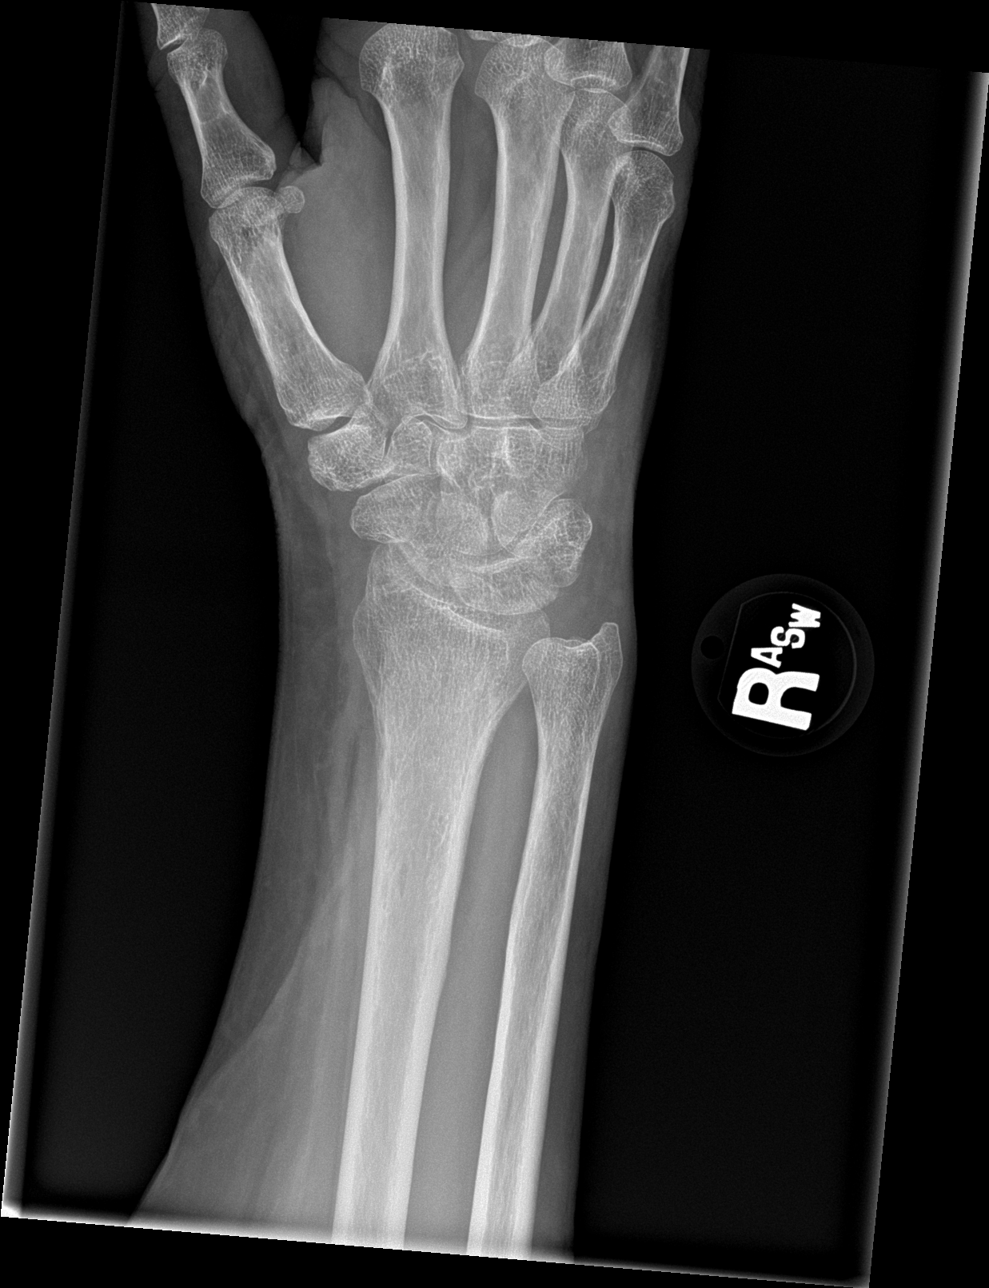

[wrist lat]
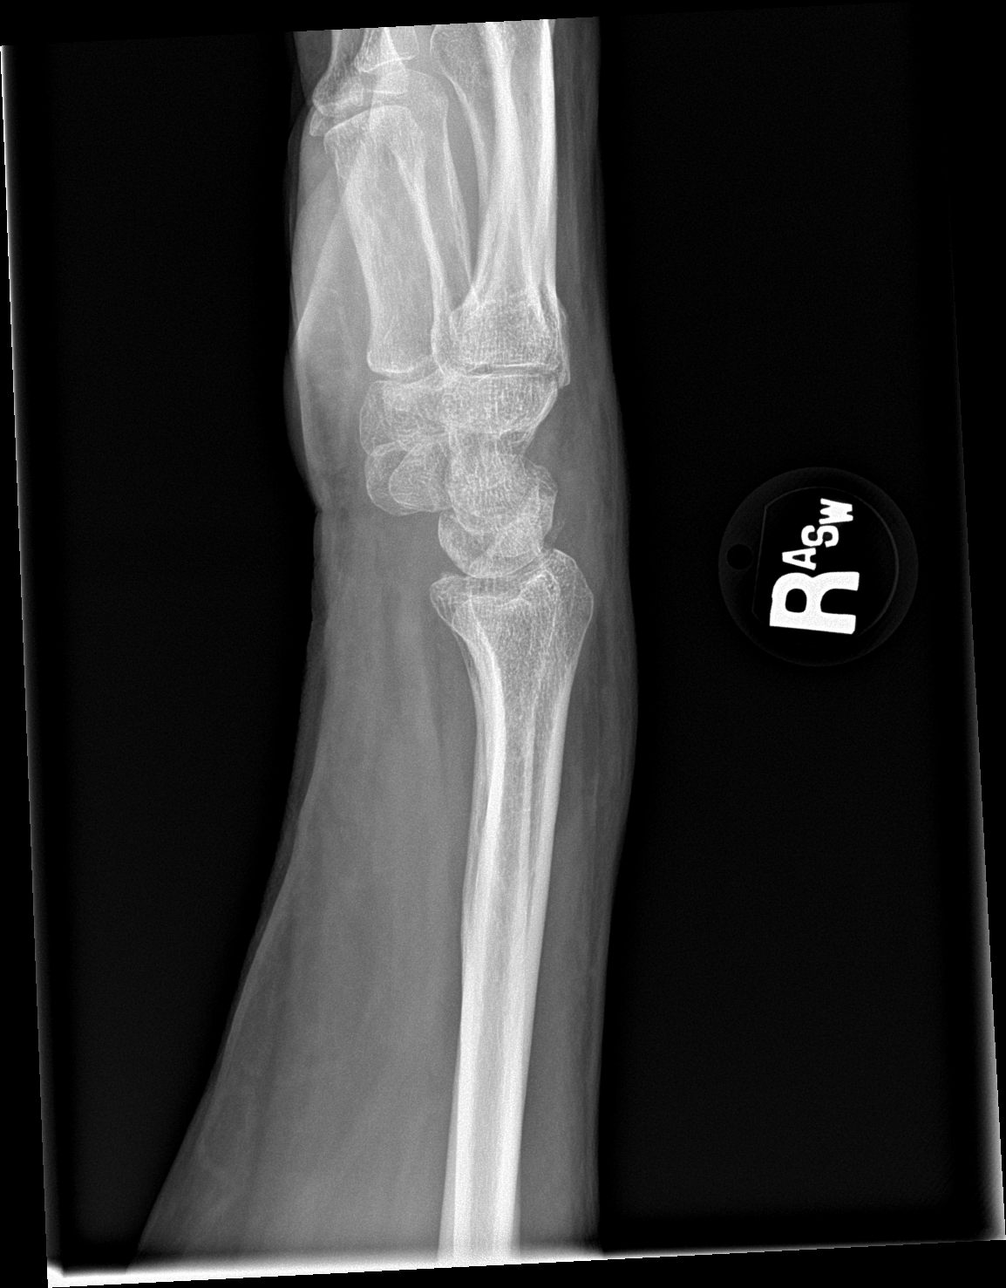

[wrist navicular]
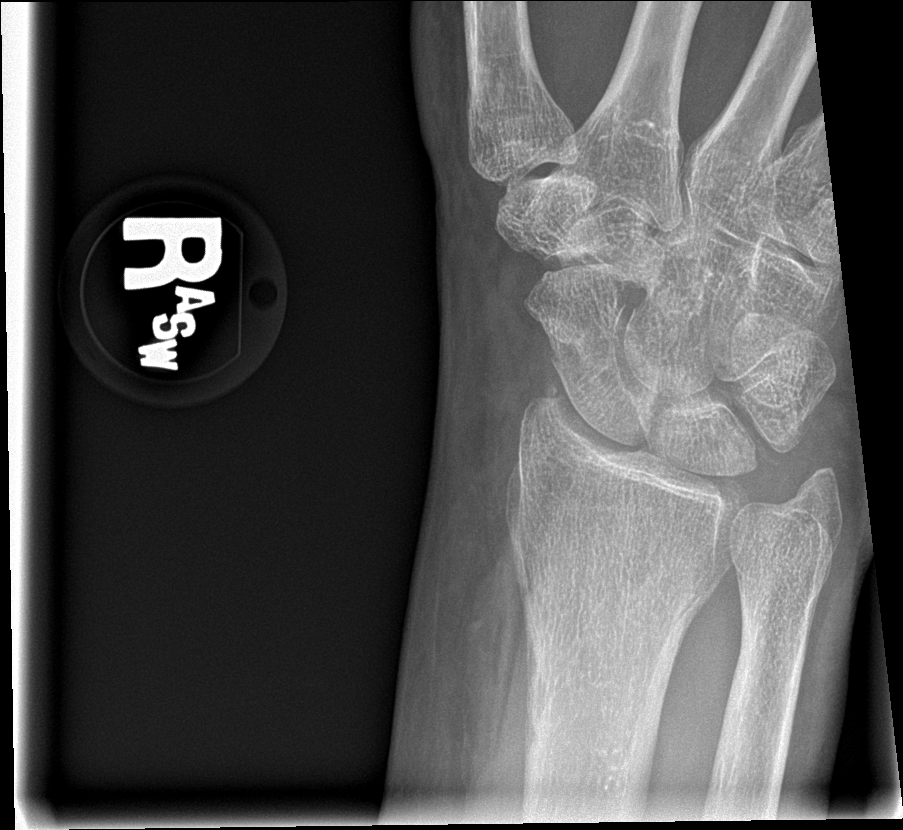

[4 of 4 positions shown; findings below may reference images not displayed]

FINDINGS: Mild soft tissue swelling is noted. There is a minimally displaced
fracture through the midshaft of the scaphoid bone identified. No
other bony abnormality is seen.
IMPRESSION: Minimally displaced midshaft scaphoid fracture.

These results will be called to the ordering clinician or
representative by the Radiologist Assistant, and communication
documented in the PACS or [REDACTED].
